# Patient Record
Sex: Female | Born: 1950 | Race: White | Hispanic: No | Marital: Single
Health system: Southern US, Community
[De-identification: ages and names within clinical notes are randomized; demographics above are authoritative.]

## PROBLEM LIST (undated history)

## (undated) DIAGNOSIS — E119 Type 2 diabetes mellitus without complications: Secondary | ICD-10-CM

---

## 2020-05-26 ENCOUNTER — Emergency Department (HOSPITAL_COMMUNITY): Payer: Medicare Other

## 2020-05-26 ENCOUNTER — Encounter (HOSPITAL_COMMUNITY): Payer: Self-pay | Admitting: Emergency Medicine

## 2020-05-26 ENCOUNTER — Emergency Department (HOSPITAL_COMMUNITY)
Admission: EM | Admit: 2020-05-26 | Discharge: 2020-05-26 | Disposition: A | Discharge status: Home / Self Care | Payer: Medicare Other | Attending: Emergency Medicine | Admitting: Emergency Medicine

## 2020-05-26 DIAGNOSIS — M545 Low back pain, unspecified: Secondary | ICD-10-CM | POA: Insufficient documentation

## 2020-05-26 DIAGNOSIS — S3210XA Unspecified fracture of sacrum, initial encounter for closed fracture: Secondary | ICD-10-CM

## 2020-05-26 DIAGNOSIS — W19XXXA Unspecified fall, initial encounter: Secondary | ICD-10-CM

## 2020-05-26 DIAGNOSIS — M542 Cervicalgia: Secondary | ICD-10-CM | POA: Diagnosis not present

## 2020-05-26 DIAGNOSIS — M533 Sacrococcygeal disorders, not elsewhere classified: Secondary | ICD-10-CM | POA: Diagnosis not present

## 2020-05-26 DIAGNOSIS — R519 Headache, unspecified: Secondary | ICD-10-CM | POA: Insufficient documentation

## 2020-05-26 DIAGNOSIS — S12601S Unspecified nondisplaced fracture of seventh cervical vertebra, sequela: Secondary | ICD-10-CM

## 2020-05-26 DIAGNOSIS — E119 Type 2 diabetes mellitus without complications: Secondary | ICD-10-CM | POA: Insufficient documentation

## 2020-05-26 HISTORY — DX: Type 2 diabetes mellitus without complications: E11.9

## 2020-05-26 MED ORDER — ACETAMINOPHEN 325 MG PO TABS: 650.0000 mg | ORAL_TABLET | Freq: Four times a day (QID) | ORAL | 0 refills | Status: AC | PRN

## 2020-05-26 MED ORDER — IBUPROFEN 400 MG PO TABS: 400.0000 mg | ORAL_TABLET | Freq: Four times a day (QID) | ORAL | 0 refills | Status: AC | PRN

## 2020-05-26 MED ORDER — OXYCODONE HCL 5 MG PO TABS: 5.0000 mg | ORAL_TABLET | Freq: Four times a day (QID) | ORAL | 0 refills | Status: AC | PRN

## 2020-05-26 MED ORDER — FENTANYL CITRATE (PF) 100 MCG/2ML IJ SOLN
50.0000 ug | Freq: Once | INTRAMUSCULAR | Status: AC
  Administered 2020-05-26: 15:00:00 50 ug via INTRAVENOUS
  Filled 2020-05-26: qty 2

## 2020-05-26 NOTE — ED Notes (Signed)
Patient transported to x-ray. ?

## 2020-05-26 NOTE — ED Provider Notes (Signed)
[facility] EMERGENCY DEPARTMENT Provider Note  CSN: [id number] Arrival date & time: 05/26/20 1021    History Chief Complaint  Patient presents with  . Fall    HPI  Danikah Budzik is a 69 y.o. female with history of DM and neuropathy reports she was at a gas station just prior to arrival when she stumbled on a curb and fell backwards landing on her back and hitting her head. Complaining of headache, neck pain and lower back pain. She did not initially complain of any hip pain to me until I asked her about the triage note at which point she said her L hip was hurting too. She denies LOC.    Past Medical History:  Diagnosis Date  . Diabetes mellitus without complication (HCC)     History reviewed. No pertinent surgical history.  No family history on file.      Home Medications Prior to Admission medications   Not on File     Allergies    Patient has no allergy information on record.   Review of Systems   Review of Systems A comprehensive review of systems was completed and negative except as noted in HPI.    Physical Exam BP (!) 143/61   Pulse 63   Temp 98.2 F (36.8 C) (Oral)   Resp 12   SpO2 97%   Physical Exam Vitals and nursing note reviewed.  Constitutional:      Appearance: Normal appearance.  HENT:     Head: Normocephalic and atraumatic.     Nose: Nose normal.     Mouth/Throat:     Mouth: Mucous membranes are moist.  Eyes:     Extraocular Movements: Extraocular movements intact.     Conjunctiva/sclera: Conjunctivae normal.  Neck:     Comments: In c-collar, tender in midline C spine Cardiovascular:     Rate and Rhythm: Normal rate.  Pulmonary:     Effort: Pulmonary effort is normal.     Breath sounds: Normal breath sounds.  Abdominal:     General: Abdomen is flat.     Palpations: Abdomen is soft.     Tenderness: There is no abdominal tenderness.  Musculoskeletal:        General: No swelling. Normal range of motion.     Comments:  Midline lumbar spine tenderness, no hip tenderness  Skin:    General: Skin is warm and dry.  Neurological:     General: No focal deficit present.     Mental Status: She is alert.  Psychiatric:        Mood and Affect: Mood normal.      ED Results / Procedures / Treatments   Labs (all labs ordered are listed, but only abnormal results are displayed) Labs Reviewed - No data to display  EKG None  Radiology DG Hip Unilat  With Pelvis 2-3 Views Right  Result Date: 05/26/2020 CLINICAL DATA:  Right hip pain after fall. EXAM: DG HIP (WITH OR WITHOUT PELVIS) 2-3V RIGHT COMPARISON:  None. FINDINGS: No acute fracture or dislocation. The pubic symphysis and sacroiliac joints are intact. Mild bilateral hip osteoarthritis. IMPRESSION: 1. No acute osseous abnormality. Electronically Signed   By: Obie Dredge M.D.   On: 05/26/2020 10:59    Procedures Procedures  Medications Ordered in the ED Medications - No data to display   MDM Rules/Calculators/A&P MDM  ED Course  I have reviewed the triage vital signs and the nursing notes.  Pertinent labs & imaging results that were available  during my care of the patient were reviewed by me and considered in my medical decision making (see chart for details).  Clinical Course as of 05/27/20 2440  Wed May 26, 2020  1411 L-spine xray images and results reviewed, will send for CT to eval possible compression fracture.  [CS]  1441 CT c-spine with wedge deformity at the site where she is tender. Will leave collar on for now.  [CS]  1522 Care of the patient signed out to Dr. Renaye Rakers at the change of shift.  [CS]    Clinical Course User Index [CS] Pollyann Savoy, MD    Final Clinical Impression(s) / ED Diagnoses Final diagnoses:  None    Rx / DC Orders ED Discharge Orders    None       Pollyann Savoy, MD 05/27/20 [phone]

## 2020-05-26 NOTE — ED Triage Notes (Signed)
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 C/o R hip pain without obvious deformity. c collar in place. Denies LOC. Pt able to transfer from stretcher to wheelchair. EMS VS 170/92, HR 86, O2 97% on ra.

## 2020-05-26 NOTE — Discharge Instructions (Addendum)
Instructions.  While you are in our emergency department, you had CT scans of the head, cervical spine, lumbar spine, as well as x-rays of your hip.  The CT scan of your cervical spine showed a possible fracture at the lower cervical spine, although this may be an old fracture.  Because you are not having any pain in your neck or tenderness on exam, I suspect this is likely an old fracture, and therefore does not need acute management.  You do, however, have a new fracture in your tailbone.  This is called your sacrum.  You have a nondisplaced fracture at S3.  I gave you a copy of your CT reports to bring with you to your doctor.  You will need to see schedule a follow-up appointment with an orthopedic or spine doctor in approximately 2 to 3 weeks to make sure that this is looked at again.  I strongly recommend that you use a walker whenever walking for the next several weeks, give you extra stability.  I prescribed you a few strong pain pills called oxycodone to take only if you are having severe pain.  Please be aware this will make you drowsy.  It may raise your risk of falling.  Try to avoid taking this medication as much as possible.  You can otherwise take ibuprofen (motrin) or tylenol over the counter, and use heating packs as needed.

## 2020-05-26 NOTE — ED Provider Notes (Signed)
Pt signed out to me.  Briefly 69 yo female here with mechanical fall, en route to [place] to visit family, just passing through [place] (lives in [place]).  Her trauma workup shows an age indeterminate fx at C7 and a nondisplaced S3 fracture.  She only complains of pain in her tailbone.  I cannot elicit any neck pain or spinal midline ttp.  I suspect the C spine fracture is likely chronic with this workup.  Plan to ambulate in the Ed, otherwise can be discharged with cane in company of daughter, who plans to drive her to [place] today.  Family is waiting in [place] and will have a walker and donut cushion for her.  Successfully ambulated with cane, able to urinate.  Discharged.   Terald Sleeper, MD 05/26/20 1929

## 2022-02-19 IMAGING — CR DG LUMBAR SPINE COMPLETE 4+V
5 series · 5 of 5 positions shown · non-contrast
Comparison: None.

CLINICAL DATA: Fall with severe lower back pain radiating to
buttock region.

EXAM:
LUMBAR SPINE - COMPLETE 4+ VIEW

[l-spine ap]
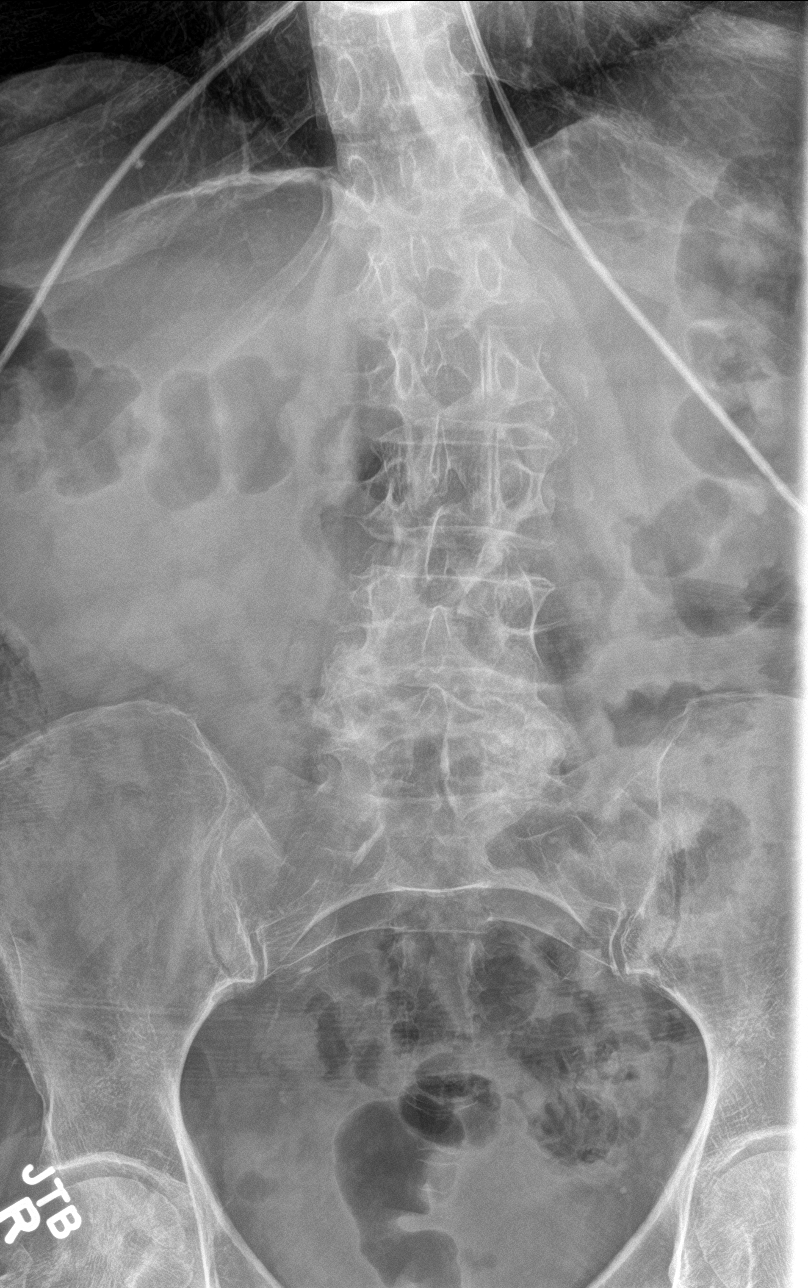

[l-spine obl (1 of 2)]
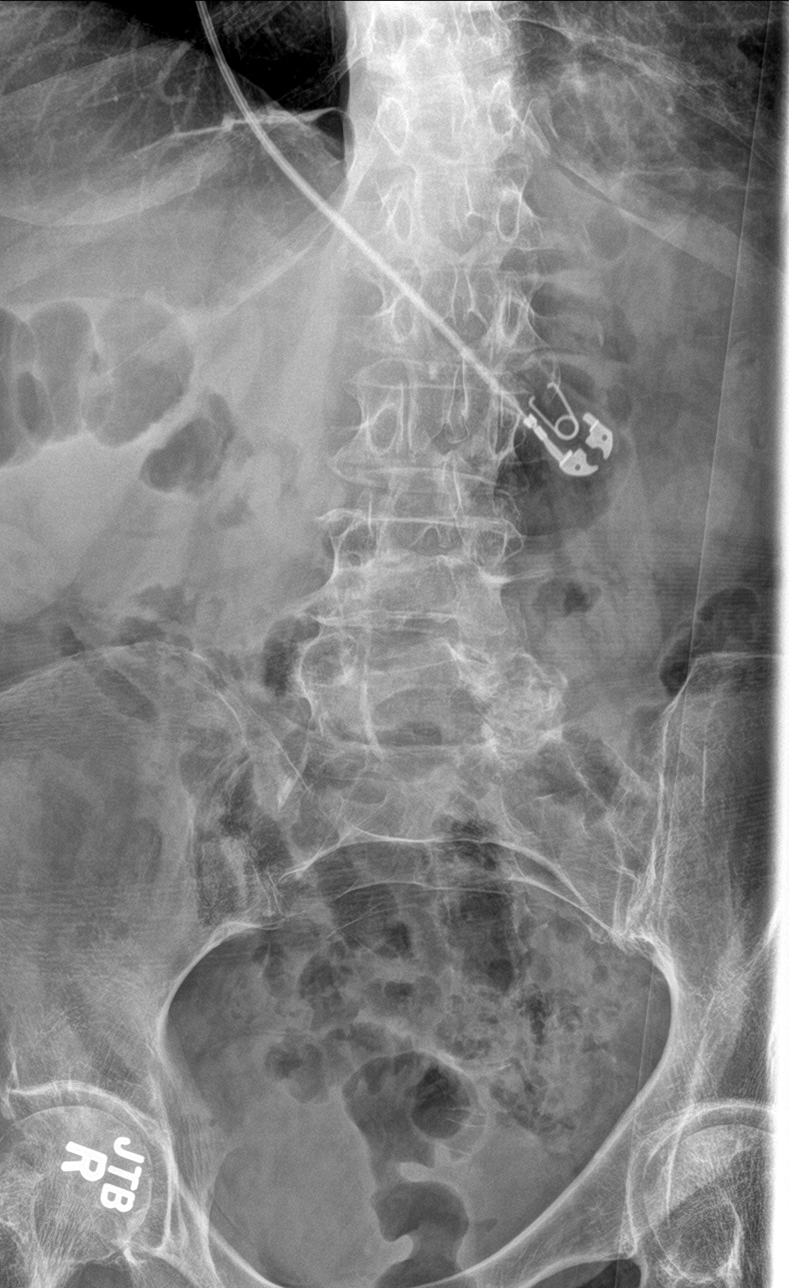

[l-spine obl (2 of 2)]
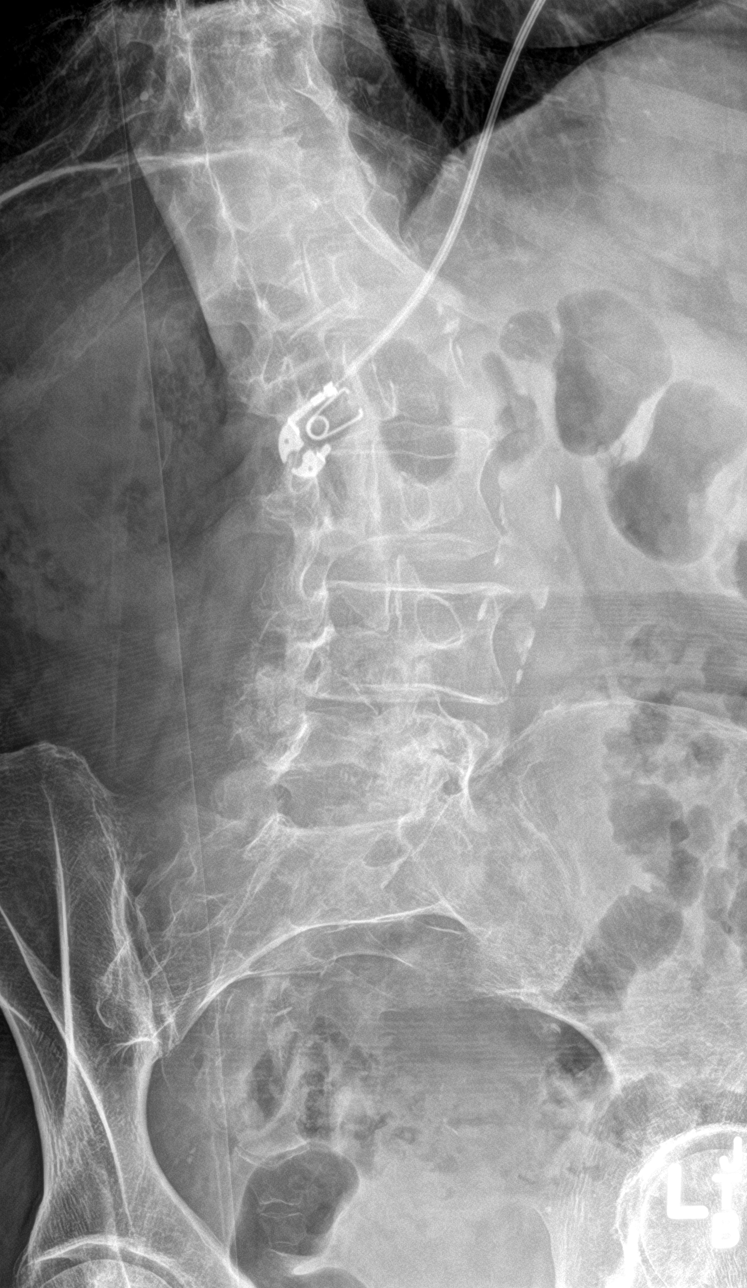

[l-spine lat]
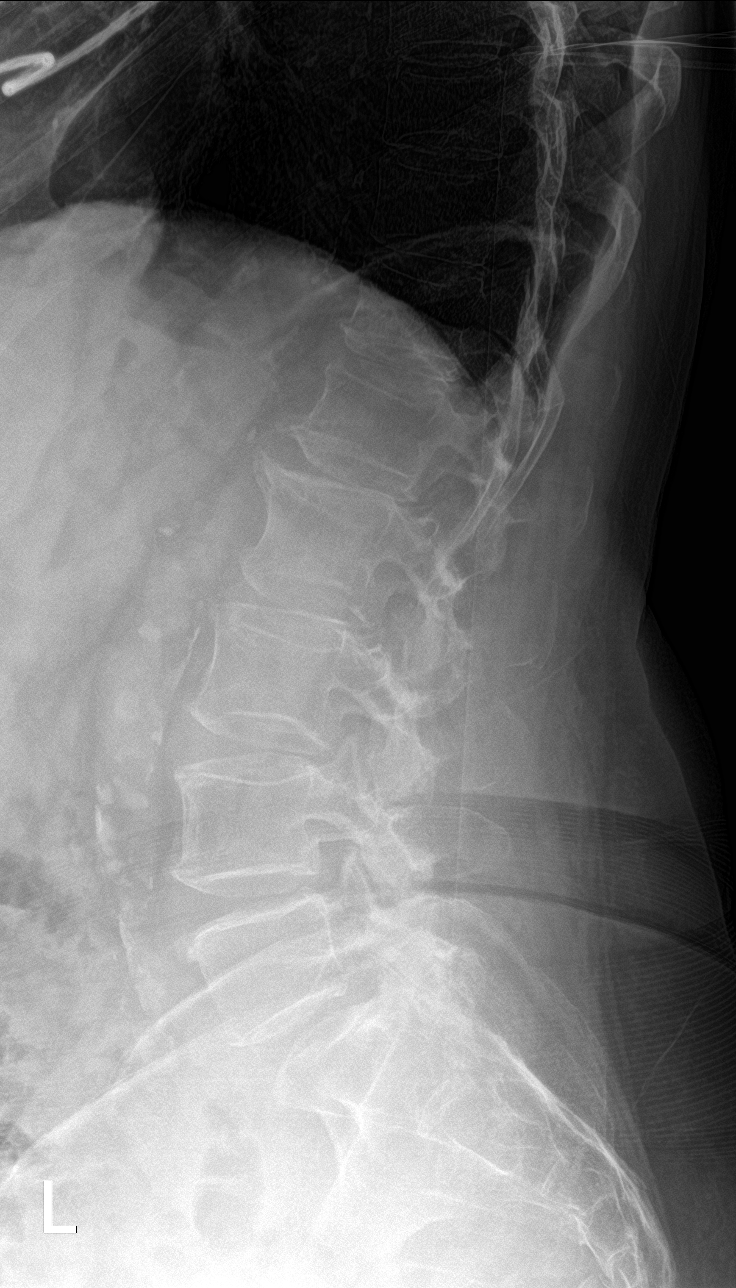

[l-spine spot]
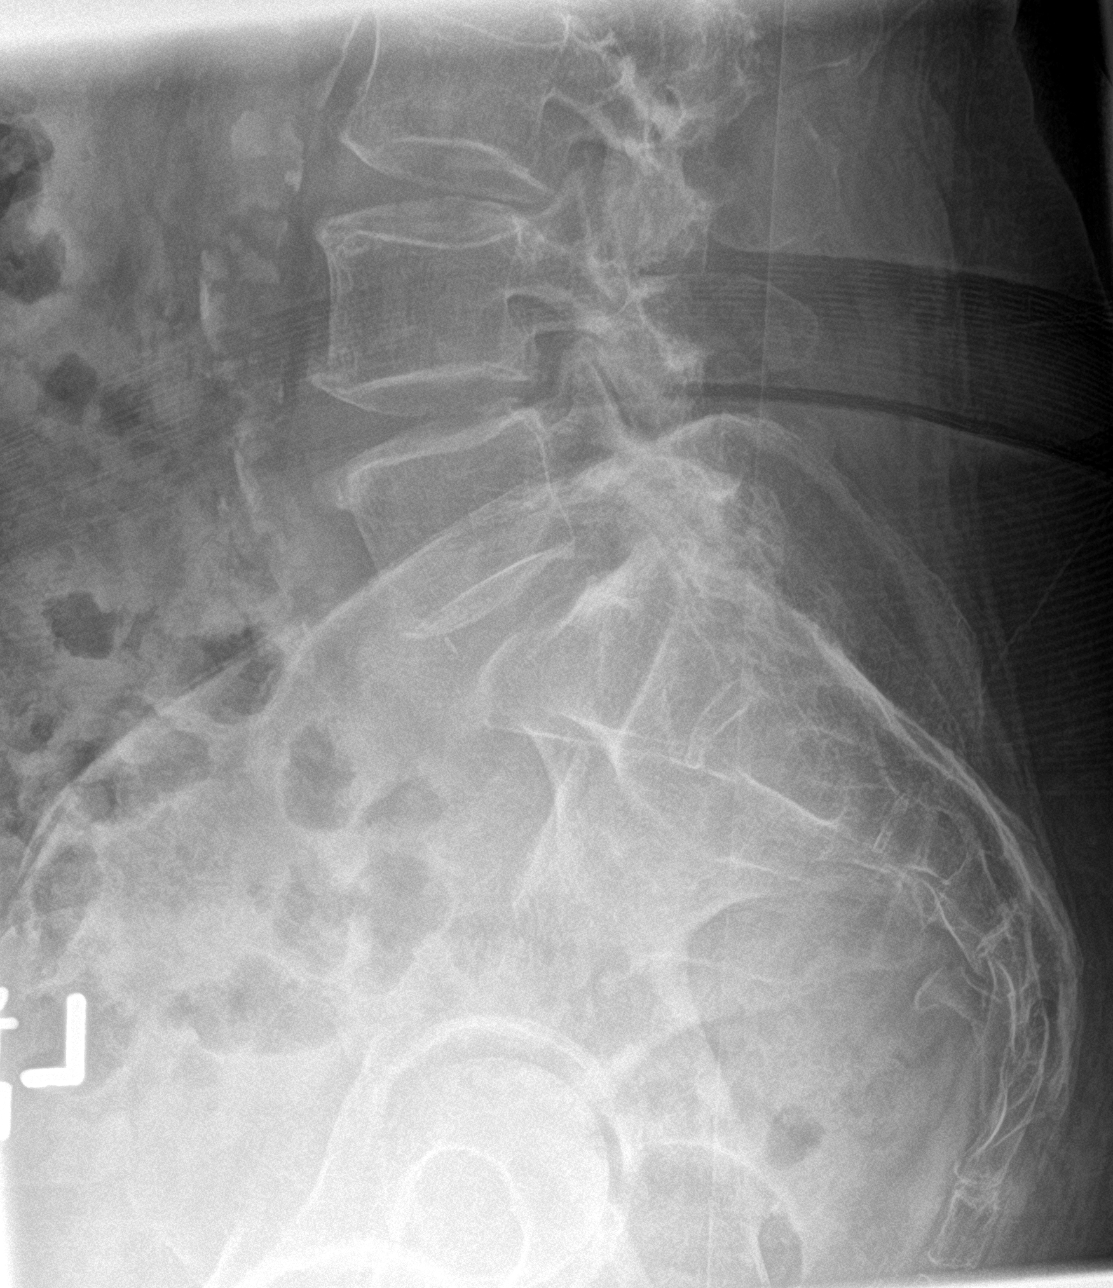

[5 of 5 positions shown; findings below may reference images not displayed]

FINDINGS: Mild T12 and L1 vertebral body height loss on the lateral
projection. No substantial sagittal subluxation. Levocurvature of
the lumbar spine.Severe facet arthropathy in the lower lumbar spine.
Mild-to-moderate multilevel degenerative disc disease. Calcific
atherosclerosis of the aorta.
IMPRESSION: 1. Mild T12 and L1 vertebral body height loss on the lateral
projection. While this could be secondary to obliquity in the
setting of the patient's levocurvature, acute compression fractures
are not excluded in the setting of trauma. CT or MRI could further
characterize if clinically indicated.
2. Severe facet arthropathy in lower lumbar spine.
